# Patient Record
Sex: Female | Born: 1955 | Race: White | Hispanic: No
Health system: Western US, Academic
[De-identification: ages and names within clinical notes are randomized; demographics above are authoritative.]

## PROBLEM LIST (undated history)

## (undated) DIAGNOSIS — E785 Hyperlipidemia, unspecified: Secondary | ICD-10-CM

## (undated) DIAGNOSIS — M159 Polyosteoarthritis, unspecified: Secondary | ICD-10-CM

## (undated) HISTORY — DX: Polyosteoarthritis, unspecified: M15.9

## (undated) HISTORY — DX: Hyperlipidemia, unspecified: E78.5

## (undated) HISTORY — PX: SURGICAL HX OTHER: 99

## (undated) HISTORY — PX: PR TOTAL ABDOMINAL HYSTERECT W/WO RMVL TUBE OVARY: 58150

---

## 2013-01-29 ENCOUNTER — Other Ambulatory Visit: Payer: Self-pay

## 2013-02-05 ENCOUNTER — Encounter (HOSPITAL_BASED_OUTPATIENT_CLINIC_OR_DEPARTMENT_OTHER): Payer: BLUE CROSS/BLUE SHIELD | Admitting: Family Medicine

## 2013-02-12 ENCOUNTER — Encounter (HOSPITAL_BASED_OUTPATIENT_CLINIC_OR_DEPARTMENT_OTHER): Payer: Self-pay | Admitting: Family Medicine

## 2013-02-12 ENCOUNTER — Ambulatory Visit: Payer: BLUE CROSS/BLUE SHIELD | Attending: Family Medicine | Admitting: Family Medicine

## 2013-02-12 VITALS — BP 119/81 | HR 88 | Temp 97.8°F | Ht 62.0 in | Wt 128.0 lb

## 2013-02-12 DIAGNOSIS — M7500 Adhesive capsulitis of unspecified shoulder: Secondary | ICD-10-CM | POA: Insufficient documentation

## 2013-02-12 NOTE — Progress Notes (Signed)
Cc: R shoulder pain    HPI:  Yolanda Wright is a 58 year old female who presents for evaluation of R shoulder pain.  Pain started in January during traveling.  No new activity or trauma.  But did have big luggage.    Saw PCP and was offered a cortisone injection.  Tried ibuprofen.  Has not been in PT.  Has lost ROM.  Feels stiff.    Has some neck pain.    Has carpal tunnel B/L so some numbness and tingling.      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    Exam:  Blood pressure 119/81, pulse 88, temperature 97.8 F (36.6 C), temperature source Oral, height 5\' 2"  (1.575 m), weight 128 lb (58.06 kg), SpO2 98.00%.  Gen- NAD  Neck- FROM in flexion, extension, side flexion and rotation without exacerbation of shoulder pain  R shoulder- no muscle atrophy, limited active ROM in flexion 130, abduction 140, external rotation 20, and internal rotation sacrum, passive ROM flexion 130, ext rot 25 (vs left 35), normal deltoid and RTC strength, 5/5 abduction, 5/5 external rotation with elbow at side, 5/5 Jobe's position without pain, non-tender over AC jt, anterior subacromial region, and biceps tendon      A/P:  (726.0) Frozen shoulder, right  (primary encounter diagnosis)  Plan: REFERRAL TO PHYSICAL THERAPY  - discussed pathophysiology and expected disease course  - believe she is just starting stiffness phase  - referred to PT  - f/u 2 months      Caren Griffins, MD

## 2013-04-24 ENCOUNTER — Encounter (HOSPITAL_BASED_OUTPATIENT_CLINIC_OR_DEPARTMENT_OTHER): Payer: BLUE CROSS/BLUE SHIELD | Admitting: Family Medicine

## 2013-07-22 ENCOUNTER — Ambulatory Visit (INDEPENDENT_AMBULATORY_CARE_PROVIDER_SITE_OTHER): Payer: BLUE CROSS/BLUE SHIELD

## 2013-07-22 ENCOUNTER — Other Ambulatory Visit (INDEPENDENT_AMBULATORY_CARE_PROVIDER_SITE_OTHER): Payer: Self-pay

## 2013-07-22 VITALS — BP 114/70 | HR 79 | Ht 63.0 in | Wt 131.0 lb

## 2013-07-22 LAB — COMPREHENSIVE METABOLIC PANEL
ALT (GPT): 55 U/L — ABNORMAL HIGH (ref 6–40)
AST (GOT): 52 U/L — ABNORMAL HIGH (ref 15–40)
Albumin: 4 g/dL (ref 3.5–5.2)
Alkaline Phosphatase (Total): 50 U/L (ref 31–132)
Anion Gap: 6 (ref 3–11)
Bilirubin (Total): 0.9 mg/dL (ref 0.2–1.3)
Calcium: 9.4 mg/dL (ref 8.9–10.2)
Carbon Dioxide, Total: 27 mEq/L (ref 22–32)
Chloride: 102 mEq/L (ref 98–108)
Creatinine: 0.66 mg/dL (ref 0.38–1.02)
GFR, Calc, African American: >60 mL/min (ref >59)
GFR, Calc, European American: >60 mL/min (ref >59)
Glucose: 96 mg/dL (ref 62–125)
Potassium: 4.1 mEq/L (ref 3.7–5.2)
Protein (Total): 6.7 g/dL (ref 6.0–8.2)
Sodium: 135 mEq/L — ABNORMAL LOW (ref 136–145)
Urea Nitrogen: 9 mg/dL (ref 8–21)

## 2013-07-22 LAB — CBC, DIFF
% Basophils: 1 %
% Eosinophils: 3 %
% Immature Granulocytes: 0 %
% Lymphocytes: 37 %
% Monocytes: 6 %
% Neutrophils: 53 %
Absolute Eosinophil Count: 0.11 10*3/uL (ref 0.00–0.50)
Absolute Lymphocyte Count: 1.41 10*3/uL (ref 1.00–4.80)
Basophils: 0.04 10*3/uL (ref 0.00–0.20)
Hematocrit: 38 % (ref 36–45)
Hemoglobin: 12.9 g/dL (ref 11.5–15.5)
Immature Granulocytes: 0 10*3/uL (ref 0.00–0.05)
MCH: 31 pg (ref 27.3–33.6)
MCHC: 34.4 g/dL (ref 32.2–36.5)
MCV: 90 fL (ref 81–98)
Monocytes: 0.23 10*3/uL (ref 0.00–0.80)
Neutrophils: 2.02 10*3/uL (ref 1.80–7.00)
Platelet Count: 221 10*3/uL (ref 150–400)
RBC: 4.16 mil/uL (ref 3.80–5.00)
RDW-CV: 13.2 % (ref 11.6–14.4)
WBC: 3.81 10*3/uL — ABNORMAL LOW (ref 4.3–10.0)

## 2013-07-22 LAB — CK, CREATINE KINASE, TOTAL ACTIVITY: Creatine Kinase Total Activity: 467 U/L — ABNORMAL HIGH (ref 30–231)

## 2013-07-22 LAB — THYROID STIMULATING HORMONE: Thyroid Stimulating Hormone: 0.779 u[IU]/mL (ref 0.400–5.000)

## 2013-07-22 LAB — SED RATE: Erythrocyte Sedimentation Rate: 18 mm/hr (ref 0–20)

## 2013-07-22 LAB — C_REACTIVE PROTEIN: C_Reactive Protein: 1.6 mg/L (ref 0.0–10.0)

## 2013-07-22 MED ORDER — PREDNISONE 5 MG OR TABS
5.0000 mg | ORAL_TABLET | Freq: Every day | ORAL | Status: AC
Start: 2013-07-22 — End: ?

## 2013-07-22 MED ORDER — GABAPENTIN 100 MG OR CAPS
ORAL_CAPSULE | ORAL | Status: AC
Start: 2013-07-22 — End: ?

## 2013-07-22 NOTE — Progress Notes (Signed)
ENCOUNTER DATE: 07/22/2013    PATIENT: Yolanda Wright  DATE OF BIRTH: 19-Feb-1956  [id number]    RHEUMATOLOGY: Yolanda Spatz, MD  PCP: Yolanda Wright          Dear Dr. Leonides Wright,    It is a pleasure meeting Yolanda Wright today.    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    Yolanda Wright comes in today with her husband, Yolanda Wright.  She complains of pain and shows this and the pain diagram to include her neck, low back, left thigh, hands and wrists.  Not shown its her right shoulder were she's had a frozen shoulder as well.    After her twins were born 36 years ago, she developed neck and back pain which is been chronic.  5 years ago, during a trip to [place], her home country, she developed sudden onset of swelling in her joints.  She's had swelling and pain since.  On prednisone.  She had about 50% improvement.  Labs showed elevated CPKs and she was diagnosed with a myositis.  She apparently had nerve studies, which showed carpal tunnel syndrome.  She's not had surgery.  She's had other studies which are not available to me.    She's been on varying doses of prednisone and azathioprine over the last 4 years, but without complete remission.  Her CPK values have ranged between 300 and 600.     Yolanda Wright is also noted some food sensitivities.  She's been off gluten for the past year.       Pain in the past week is 6 on a scale of 10, where 10 is "PAIN AS BAD as it could be".   "How are you doing?" is 6 on a scale of 10, where 10 is "VERY POORLY".   Aggravators: activities   Alleviators:  Prednisone and anti-inflammatories     Functional limitations noted on the standardized MDHAQ functional survey are:  Limitations and 8 out of 10 activities of daily living.  Unable to get a good night sleep, much difficulty dealing with anxiety and some difficulties dealing with  depression.Marland Kitchen       PROBLEM LIST:  Patient Active Problem List   Diagnosis   . Arthritis       MEDICATIONS:  Current Outpatient Prescriptions   Medication Sig   . Estradiol (VIVELLE-DOT) 0.075 MG/24HR Transdermal PATCH BIWEEKLY Place 1 patch on the skin 2 times a week.     No current facility-administered medications for this visit.        Comprehensive new patient questionnaire was reviewed in detail and scanned into the chart.  Pertinent positives and negatives will be noted.   PAST HISTORY: skin cancers  PAST SURGERIES: none  FAMILY HX:  Mother died of Alzheimer's and 61.  Father had emphysema and died at age 54.  4 children in good health, and her 30s.   SOCIAL HX: See history in EPIC.  HABITS: past smoking 20 years ago, rare alcohol.  Regular exercise with yoga, walking and stair master  REVIEW OFSYSTEMS:  13 system review (constitutional, musculoskeletal, ENT, GU, psych, GI, neuro, skin, heme, endo, eyes, cardiovas, respiratory) is obtained with previsit questionnaire that the patient filled out during today's visit.  This was reviewed in its entirety and is scanned into the record.  All are negative except these  positives: fatigue, weakness, ringing in ears, difficulty swallowing, swollen legs or feet, nocturia, vaginal dryness, night sweats, headaches and dizziness, easy bruising, anxiety, depression and poor sleep as well as pain in her hands, wrists, ankles and knees.    EXAM:  BP 114/70  Pulse 79  Ht 5\' 3"  (1.6 m)  Wt 131 lb (59.421 kg)  BMI 23.21 kg/m2  General: Pleasant and attentive. Good historian  Skin: Without signs of malar rash, nail bed pitting, vasculitic skin lesions or periungual erythema. No heliotrope.  No Gottron's patches  HEENT: Without ear swelling, very orbital edema, parotid enlargement, saddle nose, or mouth sores; eyes: without icterus, scleritis or conjunctivitis.    Neck: No thyroid enlargement, or adenopathy.    Lungs: Without audible rales or respiratory distress.    Heart:  Regular rhythm without murmurs or rubs.    Abd: Without organomegaly or general tenderness.    Neurologic: Symmetric movement of all extremities, normal light touch sensation, normal gait, normal facial movement.    MSK: mild swelling and tenderness of her wrists.  Normal neck range of motion.  No focal SI tenderness    LABS:   No results found for any previous visit.  Outside labs show CPKs were the last year and a half from 300-600.    XRAY/DIAGNOSTICS:    X RAY REPORT      EXAM: pelvis, hand, thoracic spine x-rays  DATE:  07/22/2013  INDICATIONS: evaluate for possible spondylo-arthritis  FINDINGS:  Pelvis x-ray: Hips are normal.  SI joints are normal.  There spurs lateral to the SI joint and a tendon insertion bilaterally.  Thoracic spine x-ray: Lateral spine shows degenerative spurs at several levels.  Once per extends outward remote from the corner of the vertebral body suggesting an inflammatory enthesis type spur.  Hand x-rays: No signs of condor calcic stenosis.  No carpal, MCP, PIP or DIP joint erosions or narrowing.   IMPRESSION:  1.  Nonspecific spurring lateral to the SI joints, bilateral  2.  Degenerative disc disease, thoracic spine  3.  Possible thoracic spine early inflammatory spur      ASSESSMENT:   1. 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 Polyarthritis.   Hands and wrists  4.  Food sensitivities   gluten  5.  Sleep disorder   Pain  6.  Osteopenia   DEXA, 07/22/13, T-scores: Spine = -0.9; femoral neck = -1.2   Hot flashes  7.  History of carpal tunnel syndrome    DISCUSSION & COMPLEXITY OF DECISION MAKING:  [name]'s syndrome is complex.  Does not fit into the typical myositis pattern.  She has back pain and polyarthritis, suggesting a spondyloarthropathy.  I have had patients with the spondylo-arthritis syndromes who've had mild CPK elevations.    PLAN: we discussed the following plan:  1.  NEED ALL MRI, NERVE, BASELINE BLOOD TESTS       2.  GABAPENTIN  -   100 MG BEFORE BED;  INCREASE UNTIL +  OR  -.   USUALLY 2 TO 8 PILLS PER NIGHT    3.  DEXA    4.  CXR    5.  LATER TRIAL OF PROZAC/FLUXOTIENE    6. LABS    7. PREDNISONE - 5 MG PER DAY      Thank you for allowing me to assist in Cathi's consultative care.  This visit was 60 minutes with the majority counseling..    Sincerely,  Joselyn Glassman Elwyn Reach, MD MPH    CC:  Yolanda Wright [phone]   Dr. Loreta Ave, homeopath   Kyla Balzarine

## 2013-07-22 NOTE — Patient Instructions (Addendum)
1.  NEED ALL MRI, NERVE, BASELINE BLOOD TESTS       2.  GABAPENTIN  -  100 MG BEFORE BED;  INCREASE UNTIL +  OR  -.   USUALLY 2 TO 8 PILLS PER NIGHT    3.  DEXA    4.  CXR    5.  LATER TRIAL OF PROZAC/FLUXOTIENE    6. LABS    7. PREDNISONE - 5 MG PER DAY        Care Coordination & Working Together   My Fax: [phone]   [facility] Office phone: [phone]    1.  Please join the eCare part of EPIC so we can be in email communication. Until that time and for backup, my personal (not encrypted) email is [email or web address]. Email considerations:   We really can't provide new problem assessment or new care via email. EMAIL is for you to give me feedback a simple questions.   For most problems, please start with your Primary Care Physician. Every [facility] patient must have a PCP.   I will notify you if lab abnormalities are important.  We will discuss ALL LABS at your next visit.    2. For each visit, please bring ONE PAGE SUMMARY FOR ME TO SEE AT THE BEGINNING OF EACH VISIT, in addition to our function and symptom questionnaire that we have you fill out. Please include:   Names and fax numbers of everyone on your  CARE TEAM   ALL OF YOUR MEDICATIONS:  Name, times per day, milligram/pill, and the reason you are taking it.   WHAT MEDS NEED REFILLED?   YOUR SELF-CARE PROGRAM:  What are you doing and what are your goals?   NEW SYMPTOMS, OLD SYMPTOM progress - list each with a few words of what helps or aggravates each.   QUESTIONS: What do you want me to address at the visit?   RECOMMENDATIONS: What do you think should be done?    3.  FOR THE FRONT DESK AND CLAUDIA, you will need to understand your insurance plan, formulary options for medications, authorizations required for referrals, etc.      4. CONSIDER creating a MEDICAL NOTEBOOK that you take to each doctor's appointment.  It might include:  a. Your visit preparation sheet for each visit.  b. The visit summary I give you or you get from others.  c. Copies of  tests and reports from all of your providers.   d. Information you read and find helpful about your illness and therapies.   e. Your summary health summary with dates:    All diagnoses and surgeries   Consultants seen and for what problem   Therapies tried and benefits and side effects   Vaccinations   Family history   Work and hobbies    5.  If you want COPIES of labs or my notes sent to other physicians or providers, make sure to bring their names and their fax numbers.    6. If you have an URGENT problem, there is someone on call for [facility] every day.  Please have other physicians send [place] notes and diagnostic studies.         Dr. Kary Kos Big 8 areas for Self-care.    These are 8 areas that I encourage you to consider for self-care with the purpose of stabilizing your immune and nervous system:    1.  Eliminate toxins: Toxins include alcohol, smoking, marijuana unless otherwise discussed.  I also include here deep toxic  relationships with individuals or with events of the past. If these are not let go, it is hard for the body to heal and move head.    2.  Sleep: Restorative sleep is essential for immune system and metabolic balance.  Not falling asleep, early awakening, or sleep apnea can all contribute.  A sleep consultation and evaluation may be necessary.  Stopping all computer and phone activities more than hour before bed is a good start.  Early in the evening, consider writing down issues that you may want to pursue tomorrow so you can free your mind for sleep.  Try to sleep in a cool bed and room.    3.  Stress and distress: I use these 2 words because one has to do with using medications and the other with how you think.  "Letting go" is an important moment by moment awareness for diminishing the impacts of stress. Meditation training is a good start.  Ask me about "[facility]".  However, just as you can run your car low in gas or oil, you also can deplete your body of important stabilizing chemicals  such as serotonin or dopamine.  I frequently discuss diagnostic trials of these appropriate "replacement medications".  Every person who comes into [facility] deserves an opportunity to meet with a chronic illness and disability counselor such as Larina Earthly MA [facility], [facility] chronic illness counselor.     4.  Diet and nutrition: Nutrition has been proven to be an important issue in recent years for inflammation problems.  Fish oil is anti-inflammatory at 3000 mg of EPA plus DHA.  Some people are sensitive to gluten, meat products, nightshade's, or milk products.  Seeing Leim Fabry, MS, RD, CD is an excellent start to evaluating and learning about these issues.  She is the only [place] trained registered dietitian who works with a group of rheumatologists.    5. Exercise: For patients with arthritis and pain, this can sound like a bad word.  I encourage you to start on a daily basis trying movement activities that don't hurt. Ask me about "bicycle meditation", pool therapy, yoga, Feldenkrais or Tai Chi.    6. Social:  Some people with illness isolate themselves and lose ability or interest in activities.  This is the part of life that needs to be reconsidered.  Some push themselves hard and try not to think about their problems and do not give their body a chance to recover.  This often creates a "mind-body dissociation", leading to difficulties preventing reinjury, monitoring fatigue, and understanding what their illness requires of them to do differently.  Is this you, or are you too afraid to do anything that cause pain or increases your risk for a flare?  If this sounds like you, talk to me about balance & pacing, listening to your body, flare prevention and management, and fear.    7. Creative force:  What brings meaning in life for you?  Do you have an inner energy or creative force? Where does your creative force come from? How do you direct it or how does it direct you?  How do you renew it or does it get renewed?  This may be the glue to tie all the others together.     8.  Laugh more!!

## 2013-07-23 LAB — ANA REFLEX COMPREHENSIVE PANEL
ANA: NEGATIVE
Ana Interpretation Comment 1: BORDERLINE

## 2013-07-23 LAB — HEPATITIS C AB WITH REFLEX PCR: Hepatitis C Antibody w/Rflx PCR: NONREACTIVE

## 2013-07-23 LAB — LAB UNDEFINED ORCA/EPIC ORDER

## 2013-07-23 LAB — RHEUMATOID FACTOR: Rheumatoid Factor: <13 IU/mL (ref <13)

## 2013-07-23 LAB — IMMUNOGLOBULINS A,G,M,E
Immunoglobulin A: 220 mg/dL (ref 65–420)
Immunoglobulin E: <20 U/mL (ref 0–300)
Immunoglobulin G: 1340 mg/dL (ref 620–1490)
Immunoglobulin M: 127 mg/dL (ref 40–350)

## 2013-07-23 LAB — ANTI NEUTROPHIL CYTOPLASM PNL
Anti MPO Comment: NEGATIVE
Anti MPO Level: <0.2 AI (ref 0.0–0.7)
Anti PR3 Comment: NEGATIVE
Anti PR3 Level: <0.2 AI (ref 0.0–0.7)
Antineutrophil Cytoplasmic Antibody by IF: NEGATIVE

## 2013-07-23 LAB — ANTI C. CITRULLINATED PEPTIDE2
Anti CCP2 Comment: NEGATIVE
Anti CCP2 Level: <0.5 U/mL (ref 0.0–1.5)

## 2013-07-23 LAB — CELIAC SEROLOGY REFLEX PANEL
Anti Deaminated Gliadin, IgG: 1 U (ref 0–13)
Anti tTransglutaminase, IgA: <1 U (ref 0–13)
Celiac Panel Comment 1: NEGATIVE

## 2013-07-24 LAB — ANGIOTENSIN CONVERTING ENZYME: Angiotensin Converting Enzyme (U/L) (Sendout): 51 U/L (ref 8–53)

## 2013-07-24 LAB — LYME DISEASE ANTIBODY: Lyme Disease Serology: NEGATIVE

## 2013-07-24 LAB — VITAMIN D (25 HYDROXY)
Vit D (25_Hydroxy) Total: 22.6 ng/mL (ref 20.1–50.0)
Vitamin D2 (25_Hydroxy): <1 ng/mL
Vitamin D3 (25_Hydroxy): 22.6 ng/mL

## 2013-07-27 LAB — ZINC: Zinc: 68 ug/dL (ref 60–120)

## 2013-07-27 LAB — REFERENCE LABORATORY TEST 1

## 2013-07-29 ENCOUNTER — Other Ambulatory Visit (INDEPENDENT_AMBULATORY_CARE_PROVIDER_SITE_OTHER): Payer: BLUE CROSS/BLUE SHIELD

## 2013-08-03 ENCOUNTER — Ambulatory Visit (INDEPENDENT_AMBULATORY_CARE_PROVIDER_SITE_OTHER): Payer: BLUE CROSS/BLUE SHIELD

## 2013-08-03 ENCOUNTER — Other Ambulatory Visit (INDEPENDENT_AMBULATORY_CARE_PROVIDER_SITE_OTHER): Payer: Self-pay

## 2013-08-03 VITALS — BP 104/76 | HR 102 | Temp 100.0°F | Ht 63.0 in | Wt 131.0 lb

## 2013-08-03 LAB — COMPREHENSIVE METABOLIC PANEL
ALT (GPT): 44 U/L — ABNORMAL HIGH (ref 6–40)
AST (GOT): 47 U/L — ABNORMAL HIGH (ref 15–40)
Albumin: 3.9 g/dL (ref 3.5–5.2)
Alkaline Phosphatase (Total): 48 U/L (ref 31–132)
Anion Gap: 6 (ref 3–11)
Bilirubin (Total): 0.5 mg/dL (ref 0.2–1.3)
Calcium: 8.9 mg/dL (ref 8.9–10.2)
Carbon Dioxide, Total: 26 mEq/L (ref 22–32)
Chloride: 103 mEq/L (ref 98–108)
Creatinine: 0.67 mg/dL (ref 0.38–1.02)
GFR, Calc, African American: >60 mL/min (ref >59)
GFR, Calc, European American: >60 mL/min (ref >59)
Glucose: 85 mg/dL (ref 62–125)
Potassium: 4.3 mEq/L (ref 3.7–5.2)
Protein (Total): 6.6 g/dL (ref 6.0–8.2)
Sodium: 135 mEq/L — ABNORMAL LOW (ref 136–145)
Urea Nitrogen: 6 mg/dL — ABNORMAL LOW (ref 8–21)

## 2013-08-03 LAB — CBC, DIFF
% Basophils: 2 %
% Eosinophils: 3 %
% Immature Granulocytes: 0 %
% Lymphocytes: 25 %
% Monocytes: 12 %
% Neutrophils: 58 %
Absolute Eosinophil Count: 0.11 10*3/uL (ref 0.00–0.50)
Absolute Lymphocyte Count: 0.88 10*3/uL — ABNORMAL LOW (ref 1.00–4.80)
Basophils: 0.07 10*3/uL (ref 0.00–0.20)
Hematocrit: 36 % (ref 36–45)
Hemoglobin: 12.3 g/dL (ref 11.5–15.5)
Immature Granulocytes: 0 10*3/uL (ref 0.00–0.05)
MCH: 31.2 pg (ref 27.3–33.6)
MCHC: 34.4 g/dL (ref 32.2–36.5)
MCV: 91 fL (ref 81–98)
Monocytes: 0.42 10*3/uL (ref 0.00–0.80)
Neutrophils: 2.04 10*3/uL (ref 1.80–7.00)
Platelet Count: 192 10*3/uL (ref 150–400)
RBC: 3.94 mil/uL (ref 3.80–5.00)
RDW-CV: 13.1 % (ref 11.6–14.4)
WBC: 3.51 10*3/uL — ABNORMAL LOW (ref 4.3–10.0)

## 2013-08-03 LAB — CK, CREATINE KINASE, TOTAL ACTIVITY: Creatine Kinase Total Activity: 454 U/L — ABNORMAL HIGH (ref 30–231)

## 2013-08-03 MED ORDER — FLUOXETINE HCL 10 MG OR CAPS
10.0000 mg | ORAL_CAPSULE | Freq: Every day | ORAL | Status: AC
Start: 2013-08-03 — End: ?

## 2013-08-03 MED ORDER — GABAPENTIN 300 MG OR CAPS
ORAL_CAPSULE | ORAL | Status: AC
Start: 2013-08-03 — End: ?

## 2013-08-03 NOTE — Progress Notes (Signed)
I        ENCOUNTER DATE:  08/03/2013   PATIENT: Yolanda Wright  1956-04-09   [id number]    RHEUMATOLOGY: Magdalen Spatz, MD  PCP: Donata Duff      HISTORY:  The patient's issues for today's visit are: first follow-up visit    Overall, doing much better.  Sleeping better. Prednisone 1 a day is helpful in the mornings.     Pain in the past week is 3 on a scale of 10, where 10 is "PAIN AS BAD as it could be".   "How are you doing?" is 2 on a scale of 10, where 10 is "VERY POORLY".   Aggravators: activities   Alleviators:  prednisone     Functional limitations noted on the standardized MDHAQ functional survey are:  Limitations and 7 out of 10 activities of daily living.  Having some difficulty still getting good night sleep, dealing with anxiety as well as depression.       EPIC PROBLEM LIST:   Patient Active Problem List   Diagnosis   . Arthritis     MEDICATIONS:   Current Outpatient Prescriptions   Medication Sig   . Estradiol (VIVELLE-DOT) 0.075 MG/24HR Transdermal PATCH BIWEEKLY Place 1 patch on the skin 2 times a week.   . Gabapentin 100 MG Oral Cap 1-3 qhs   . PredniSONE 5 MG Oral Tab Take 1 tablet (5 mg) by mouth daily.     No current facility-administered medications for this visit.     PAST HISTORY: reviewed and updated EPIC history, social history, family history sections. The comprehensive problem list below was updated.  See problem list below  ROS:  13 system review (constitutional, musculoskeletal, ENT, GU, psych, GI, neuro, skin, heme, endo, eyes, cardiovas, respiratory) is obtained with previsit questionnaire that the patient filled out during today's visit.  This was reviewed in its entirety and is scanned into the record.  All are negative except these positives: 30 minutes.  Morning stiffness, swelling, muscle weakness, night sweats, dry mouth, ringing in her ears, balance problems, numbness or legs, pain in the chest, heartburn, constipation    EXAM:  BP 104/76  Pulse 102  Temp(Src) 100 F  (37.8 C) (Oral)  Ht 5\' 3"  (1.6 m)  Wt 131 lb (59.421 kg)  BMI 23.21 kg/m2  General: Pleasant and attentive. Good communicator  Skin: Without signs of malar rash, nail bed pitting, vasculitic skin lesions or periungual erythema.    HEENT: Without ear swelling, very orbital edema, parotid enlargement, saddle nose, or mouth sores; eyes: without icterus, scleritis or conjunctivitis.    Neck: No thyroid enlargement, or adenopathy.    Lungs: Without audible rales or respiratory distress.    Heart: Regular rhythm without murmurs or rubs.    Abd: Without organomegaly or general tenderness.    Neurologic: Symmetric movement of all extremities, normal light touch sensation, normal gait, normal facial movement.    MSK: no synovitis of her hands.  Large joints move normally.    LABS:   Patient Active Problem List   Component Date Value Range Status   . Reference Lab: Spec. Type 1 (Sendo* 07/22/2013 Serum   Final   . Reference Lab: Test Reqstd 1 (Send* 07/22/2013 Saccharomyces Antibodies, IgA & IgG   Corrected    CORRECTED ON 12/04 AT 5409: PREVIOUSLY REPORTED AS SACCHAROMYCES ANTIBODY   . Reference Lab: Result 1 (Sendout) 07/22/2013 (NOTE)   Final    Comment: RESULTS:                            [  Saccharomyces cerevisiae Ab, IgA:  19.2 U].                            [ Saccharomyces cerevisiae Ab, IgG: <10.0 U].    . Reference Lab: Ref. Range 1 (Sendo* 07/22/2013 (NOTE)   Final    Comment: REFERENCE RANGES:                            Saccharomyces cerevisiae Ab, IgA and IgG:                            [ Negative:      < or = 20.0 U].                            [ Equivocal:     20.1 - 24.9 U].                            [ Weak Positive: 25.0 - 34.9 U].                            [ Positive:      > or = 35.0 U].    . Reference Lab: Name 1 (Sendout) 07/22/2013    Final                    Value:Test Performed By: [facility] Dept of Lab Med and                           Pathology   Patient Active Problem List   Component Date  Value Range Status   . WBC 07/22/2013 3.81* 4.3 - 10.0 THOU/uL Final   . RBC 07/22/2013 4.16  3.80 - 5.00 mil/uL Final   . Hemoglobin 07/22/2013 12.9  11.5 - 15.5 g/dL Final   . Hematocrit 16/05/9603 38  36 - 45 % Final   . MCV 07/22/2013 90  81 - 98 fL Final   . MCH 07/22/2013 31.0  27.3 - 33.6 pg Final   . MCHC 07/22/2013 34.4  32.2 - 36.5 g/dL Final   . Platelet Count 07/22/2013 221  150 - 400 THOU/uL Final   . RDW-CV 07/22/2013 13.2  11.6 - 14.4 % Final   . % Total Neut. 07/22/2013 53   Final   . % Lymphocytes 07/22/2013 37   Final   . % Monocytes 07/22/2013 6   Final   . % Eosinophils 07/22/2013 3   Final   . % Basophils 07/22/2013 1   Final   . % Immature Granulocytes 07/22/2013 0   Final   . Neutrophils 07/22/2013 2.02  1.80 - 7.00 THOU/uL Final   . Lymphocytes 07/22/2013 1.41  1.00 - 4.80 THOU/uL Final   . Monocytes 07/22/2013 0.23  0.00 - 0.80 THOU/uL Final   . Eosinophils 07/22/2013 0.11  0.00 - 0.50 THOU/uL Final   . Basophils 07/22/2013 0.04  0.00 - 0.20 THOU/uL Final   . Immature Granulocytes 07/22/2013 0.00  0.00 - 0.05 THOU/uL Final   . RBC Morphology 07/22/2013 See CBC - no additional findings   Final   . Platelet Morphology 07/22/2013 See CBC - no additional findings  Final   . WBC Morphology 07/22/2013 See CBC - no additional findings   Final   . Sodium 07/22/2013 135* 136 - 145 mEq/L Final   . Potassium 07/22/2013 4.1  3.7 - 5.2 mEq/L Final   . Chloride 07/22/2013 102  98 - 108 mEq/L Final   . Carbon Dioxide, Total 07/22/2013 27  22 - 32 mEq/L Final   . Anion Gap 07/22/2013 6  3 - 11 Final   . Glucose 07/22/2013 96  62 - 125 mg/dL Final   . Urea Nitrogen 07/22/2013 9  8 - 21 mg/dL Final   . Creatinine 16/05/9603 0.66  0.38 - 1.02 mg/dL Final   . Protein (Total) 07/22/2013 6.7  6.0 - 8.2 g/dL Final   . Albumin 54/04/8118 4.0  3.5 - 5.2 g/dL Final   . Bilirubin (Total) 07/22/2013 0.9  0.2 - 1.3 mg/dL Final   . Calcium 14/78/2956 9.4  8.9 - 10.2 mg/dL Final   . AST (GOT) 21/30/8657 52* 15 - 40 U/L  Final   . Alkaline Phosphatase (Total) 07/22/2013 50  31 - 132 U/L Final   . ALT (GPT) 07/22/2013 55* 6 - 40 U/L Final   . Gfr, Calc, European American 07/22/2013 >60  >59 mL/min Final   . GFR, Calc, African American 07/22/2013 >60  >59 mL/min Final   . GFR, Information 07/22/2013    Final                    Value:Calculated GFR in mL/min/1.73 m2 by MDRD equation.                            Inaccurate with changing renal function.  See                           [email or web address]                             . CRP, High Sensitivity 07/22/2013 1.6  0.0 - 10.0 mg/L Final   . Anti CCP2 Level 07/22/2013 <0.5  0.0 - 1.5 U/mL Final   . Anti CCP2 Comment 07/22/2013 Negative   Final   . Rheumatoid Factor 07/22/2013 <13  <13 IU/mL Final   . Erythrocyte Sedimentation Rate 07/22/2013 18  0 - 20 mm/hr Final   . ANA 07/22/2013 Negative  NRN Final   . ANA Pattern by IF 07/22/2013 None   Final   . Ana Screen By Multiplex 07/22/2013 See ANA Interpretation Comment* NRN Final   . Ana Interpretation Comment 1 07/22/2013    Final                    Value:Screen negative by IFA and indeterminate/borderline                           result for one or more of the following antibodies:                           chromatin, ribosomal P, Sm, RNP, SSA/Ro, SSB/La,                           centromere, Scl70 and Jo1.    Comment: With negative IFA results the indeterminate multiplex result is  likely to be                            clinically insignificant.  To order the complete reflex panel contact the lab                            at [phone].   . Angiotensin Converting Enzyme (U/L* 07/22/2013 51  8 - 53 U/L Final    Comment: (NOTE)                           Test Performed by:                           [facility]                           [address] [place], [place], [place] [address]                           Laboratory Director: Lyda Jester A. Tonny Bollman, M.D.   . Lab Test  Requested 07/22/2013 Saccharomyces Cerevisiae Antibodies (Code: 332)   Final   . Specimen Type/Description 07/22/2013 Blood   Final   . Additional Test Information 07/22/2013 N/A   Final   . Test Request Status 07/22/2013 Order Processed   Final   . Anti tTransglutaminase, IgA 07/22/2013 <1  0 - 13 U Final    Methodology and reference range changed on 06/22/13.   Marland Kitchen Anti Deaminated Gliadin, IgG 07/22/2013 1  0 - 13 U Final    Methodology and reference range changed on 06/22/13.   . Celiac Panel Comment 1 07/22/2013   CELNEG Final                    Value:Negative celiac disease serology panel. These results                           indicate that active celiac disease is highly unlikely                           at the time of the antibody testing.   . Vitamin D2 (25_Hydroxy) 07/22/2013 <1.0   Final   . Vitamin D3 (25_Hydroxy) 07/22/2013 22.6   Final   . Vit D (25_Hydroxy) Total 07/22/2013 22.6  20.1 - 50.0 ng/mL Final   . Vit D (25_Hydroxy) Interp 07/22/2013 Normal:            20.1-50.0 ng/mL   Final    Comment: For more information see                            [email or web address].   Marland Kitchen Zinc 07/22/2013 68  60 - 120 mcg/dL Final   . Lyme Disease Serology (Sendout) 07/22/2013 Negative  NRN Final    Comment: (NOTE)                           Serologic response to B. burgdorferi infection is not  detected, but cannot rule out early infection during                           which low or undetectable antibody levels to                           B. burgdorferi may be present. If clinically indicated,                           a new serum specimen should be submitted in 7-14 days.                           Test Performed by:                           [facility]                           [address] [place], [place], [place] [address]                           Laboratory Director: Lyda Jester A. Tonny Bollman, M.D.   . CREATINE KINASE TOTAL  ACTIVITY 07/22/2013 467* 30 - 231 U/L Final   . Hepatitis C Ab w/Rflx PCR 07/22/2013 Nonreactive  NREAC Final   . Hepatitis C Ab Interpretation 07/22/2013    Final                    Value:No evidence of antibody to hepatitis C virus (HCV).                            Anti-HCV may develop slowly (6 weeks - 6 months) over                           the course of hepatitis C virus infection.  Rarely it                           may be absent in cases of chronic infection.  Anti-HCV                           may also disappear after recovery from acute,                           self-limited disease.   . Thyroid Stimulating Hormone 07/22/2013 0.779  0.400 - 5.000 uIU/mL Final   . Immunoglobulin A 07/22/2013 220  65 - 420 mg/dL Final   . Immunoglobulin G 07/22/2013 1340  620 - 1490 mg/dL Final   . Immunoglobulin M 07/22/2013 127  40 - 350 mg/dL Final   . Immunoglobulin E 07/22/2013 <20  0 - 300 U/mL Final   . ANCA, by IF 07/22/2013 Negative  NRN Final   . Anti PR3 Level 07/22/2013 <0.2  0.0 - 0.7 AI Final   . Anti PR3 Comment 07/22/2013 Negative  NRN Final   . Anti MPO Level 07/22/2013 <0.2  0.0 - 0.7 AI Final   . Anti MPO Comment 07/22/2013 Negative  NRN Final          XRAY/DIAGNOSTICS: see list below    UPDATED PROBLEM LIST:  1. 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 Polyarthritis.   Hands and wrists  4.  Food sensitivities   gluten  5.  Sleep disorder   Pain  6.  Osteopenia   DEXA, 07/22/13, T-scores: Spine = -0.9; femoral neck = -1.2   Hot flashes  7.  History of carpal tunnel syndrome  8. GERD   9. "Myositis", 2010   CPK = 467  10. 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  Depression, anxiety   Possible serotonin deficiency    ASSESSMENT:   1. 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  I reviewed such reports.  2. Mild anxiety and depression.  Discussed serotonin modulation of stress and immune system.    PLAN:  2.  Prolonged discussion of above issues, we agreed to the following plan:  1.  Prednisone - 5 mg in AM    2.  Check labs    3.  Vit D 2000 IU per day    4.  Bring reports    5. Gabapentin up to 900 mg per evening   300 mg & 100 mg pills    6.  Prozac 10 mg in AM    7.   See min 2 months    TIME COUNSELING:  The total time for this visit was over 40 minute visit, majority counseling.      CC:  Donata Duff    (fax: [phone])

## 2013-08-03 NOTE — Patient Instructions (Addendum)
1.  Prednisone - 5 mg in AM    2.  Check labs    3.  Vit D 2000 IU per day    4.  Bring reports    5. Gabapentin up to 900 mg per evening   300 mg & 100 mg pills    6.  Prozac 10 mg in AM    7 .  See min 2 months    Care Coordination & Working Together   My Fax: [phone]   [facility] Office phone: [phone]    1.  Please join the eCare part of EPIC so we can be in email communication. Until that time and for backup, my personal (not encrypted) email is [email or web address]. Email considerations:   We really can't provide new problem assessment or new care via email. EMAIL is for you to give me feedback a simple questions.   For most problems, please start with your Primary Care Physician. Every [facility] patient must have a PCP.   I will notify you if lab abnormalities are important.  We will discuss ALL LABS at your next visit.    2. For each visit, please bring ONE PAGE SUMMARY FOR ME TO SEE AT THE BEGINNING OF EACH VISIT, in addition to our function and symptom questionnaire that we have you fill out. Please include:   Names and fax numbers of everyone on your  CARE TEAM   ALL OF YOUR MEDICATIONS:  Name, times per day, milligram/pill, and the reason you are taking it.   WHAT MEDS NEED REFILLED?   YOUR SELF-CARE PROGRAM:  What are you doing and what are your goals?   NEW SYMPTOMS, OLD SYMPTOM progress - list each with a few words of what helps or aggravates each.   QUESTIONS: What do you want me to address at the visit?   RECOMMENDATIONS: What do you think should be done?    3.  FOR THE FRONT DESK AND CLAUDIA, you will need to understand your insurance plan, formulary options for medications, authorizations required for referrals, etc.      4. CONSIDER creating a MEDICAL NOTEBOOK that you take to each doctor's appointment.  It might include:  a. Your visit preparation sheet for each visit.  b. The visit summary I give you or you get from others.  c. Copies of tests and reports from all of your providers.      d. Information you read and find helpful about your illness and therapies.   e. Your summary health summary with dates:    All diagnoses and surgeries   Consultants seen and for what problem   Therapies tried and benefits and side effects   Vaccinations   Family history   Work and hobbies    5.  If you want COPIES of labs or my notes sent to other physicians or providers, make sure to bring their names and their fax numbers.    6. If you have an URGENT problem, there is someone on call for [facility] every day.  Please have other physicians send [place] notes and diagnostic studies.

## 2013-09-02 ENCOUNTER — Ambulatory Visit (INDEPENDENT_AMBULATORY_CARE_PROVIDER_SITE_OTHER): Payer: BLUE CROSS/BLUE SHIELD | Admitting: Registered"

## 2013-09-02 VITALS — Ht 63.0 in | Wt 130.0 lb

## 2013-09-02 DIAGNOSIS — R5383 Other fatigue: Secondary | ICD-10-CM | POA: Insufficient documentation

## 2013-09-02 DIAGNOSIS — IMO0001 Reserved for inherently not codable concepts without codable children: Secondary | ICD-10-CM | POA: Insufficient documentation

## 2013-09-02 DIAGNOSIS — H9319 Tinnitus, unspecified ear: Secondary | ICD-10-CM | POA: Insufficient documentation

## 2013-09-02 DIAGNOSIS — E781 Pure hyperglyceridemia: Secondary | ICD-10-CM

## 2013-09-02 DIAGNOSIS — R42 Dizziness and giddiness: Secondary | ICD-10-CM | POA: Insufficient documentation

## 2013-09-02 DIAGNOSIS — E7800 Pure hypercholesterolemia, unspecified: Secondary | ICD-10-CM

## 2013-09-02 DIAGNOSIS — F419 Anxiety disorder, unspecified: Secondary | ICD-10-CM | POA: Insufficient documentation

## 2013-09-02 NOTE — Progress Notes (Signed)
History    Yolanda Wright is here primarily to determine if there is anything she can do with her diet to reduce her cholesterol (408) and triglycerides (>600 ppr).  She has failed medications to reduce and has seen only moderate benefits from niacin and garlic supplementation.  She is doing well with systemic inflammation/swelling, though still experiences and energy crash by the afternoon, hot flashes, poor sleep, anxiety, sinus pain/pressure, tinnitus, gas and persistent belching.  Her joint swelling has reduced primarily from omitting gluten and reducing refined sugars in the diet, though she still craves sugar and eats a primarily carbohydrate dense diet.    General Impression    There are two areas Yolanda Wright and I focused on today:  Hyperlipidemia and histamine intolerance.    Hyperlipidemia:  Her cholesterol and TG's are significantly elevated.  She does not consume much in the way of saturated fats or animal products, but she does eat a significant amount of carbohydrates to quell her sweet tooth.  I do question if a complete grain restriction may benefit her in this area and it would also be interesting to see if it helps with her swelling, tinnitus and other allergy type symptoms.  This diet would need to be followed for an extended period of time to see if it impacts her cholesterol and TG numbers, so I would like to focus on histamine intolerance first since this can be determined in a 2 week period of time.    Histamine intolerance:  She has multiple symptoms that could be related to histamine production including hot flashes, tinnitus, gas, belch, sinus pain/pressure, anxiety and poor sleep.  Her swelling symptoms increased while traveling home to [place], so I do question whether an environmental change may have stimulated her inflammation at that time.  Her symptoms have improved the longer she has been back in [place], but I think it's worth ruling out histamine intolerance first to see if this will benefit  her.  We will start here and instruction for a low histamine diet was provided today.  We will f/u in 3 weeks to reassess.  If there is no benefit, will move forward with a low starch diet.    Session length:  90 mins    Subjective/Assessment     Excesses:  carbohydrates   Deficiencies:  water   Meal balance:  Needs to bump up her protein, mostly carb based.  Omega 3/6 ratio:  OK  Plant intake:  Excellent, juices, eats a mostly plant based diet.  Fluids:  Low water intake, 3 small containers qd.  Coffee 1 cup qd.  Should considering increasing water.  Did not discuss today, need to at next session.  Food sensitivities or allergies:  Found that gluten/sugar free has improved her symptoms of swelling considerably.  Should consider looking at all grains, especially as it r/t her cholesterol and Tg's, but will start with r/o histamine intolerance first given the nature of her symptoms.  Food likes/dislikes/avoidances:  Craves sugar.  No dislikes.  Digestion:  Gas (belching) and bloat, worse after eating.  Also notes intensification with sardines and aged meats.  Do question histamine intolerance given the nature of her reactions.  But her sugar cravings and benefits from a gluten/sugar free diet do increase my suspicion around bacterial issues.  If no benefit from histamine diet, should consider HBT.  Denies diarrhea, constipation, heartburn, nausea, vomiting.  Weight:  Stable. BMI 23 WNL.  Physical activity:  Just started yoga.  Has been low secondary  to pain/inflammation/fatigue.  Supplements:  Taking D, B12, MVI.  Did not make any recommendations today, will hold until I get a better sense of what she needs.  She did find benefit to her cholesterol by taking niacin and garlic, but is no longer taking. May encourage return.    DNI:  NA  Pertinent family hx:  NA  Cooking skills:  Excellent, former Investment banker, operationalchef.    Shopping preferences:  CM, TJ  Restaurant habits:  No    Emotional and Social    Low stress, but does note that  her swelling increases when she is more stressed.  Emotional eater.  Husband, Gery PrayBarry [place] be [place], may necessitate move at the end of February.      Plan    Low histamine diet x 2 wks, then challenge  Discuss water intake at next session.  F/u 3 wks.

## 2013-10-01 ENCOUNTER — Ambulatory Visit (INDEPENDENT_AMBULATORY_CARE_PROVIDER_SITE_OTHER): Payer: BLUE CROSS/BLUE SHIELD | Admitting: Registered"

## 2013-10-01 DIAGNOSIS — E7800 Pure hypercholesterolemia, unspecified: Secondary | ICD-10-CM

## 2013-10-01 DIAGNOSIS — E781 Pure hyperglyceridemia: Secondary | ICD-10-CM

## 2013-10-01 NOTE — Progress Notes (Signed)
S:  Two weeks on a low histamine diet brought significant benefits to sleep, anxiety, sinuses, vision, digestion, body swelling and energy.  She has since returned some of the histamines back to the diet but as long as she limits them, she is able to keep her inflammation under control.  No changes to hot flashes.  Still must avoid sugars in order to avoid severe GI issues.  Still having gas and bloat, but better on low histamine.  Still wants to know how to manage cholesterol and TG's.    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  I provided her education on managing it including supplementation and dietary threshold.  It's unclear how this will benefit her cholesterol and TG's however the supplementation I have recommended involves liver support which may help with cholesterol management.  I would like to have her levels checked again in a month or two after practicing a low histamine diet to see if reducing inflammation impacts her lipids at all. She is doing better at managing her sugar intake, but her GI is still an issue and given the level of food sensitivities she has, I would like to order a HBT to rule out SIBO as a stimulator of histamines and food sensitivities.  If it's positive we will need to focus more on reducing carbohydrates which, in turn, may help reduce her TG's and chol.  We'll f/u with HBT results in hand.    P:  1.  Determine personal threshold to histamines.  Add in higher histamine fruits and veg (tomatoes, spinach, avocado and banana are higher). Play with amounts to determine how much histamine you can tolerate.      2.  Diamine Oxidase:  An enzyme that can help to break down histamines in foods.  Take with meals.  Available at [employer].    *  Histame  *  Swanson's Daosin  *  Xymogen Histdao      3.  Supplements to help with liver clearance of histamines     --  L-methionine:  1,000 mg twice per day   -- NAC:  500 mg twice per day   --  B6:  100 mg twice per day   -- zinc:  30 mg twice per day with food.  Take zinc for three months only then discontinue.     4.  Supplements to try to reduce histamine load when having a reaction     -- Quercitin   -- Stinging nettle tea   -- vitamin C:  Up to 5,000 mg per day, increase slowly as tolerated, in divided doses to avoid diarrhea    5.  Resources:     -- aminerecipes.com   -- AndroidPDAs.co.[email or web address]   -- failsafediet.org    6.  Relaxation, meditation, yoga, deep breathing before meals -- stimulate parasympathetic nervous system activity to reduce hypersensitization of nerves.      7.  Hydrogen breath test:  [facility] at [phone] to schedule a hydrogen breath test to rule out SIBO.    8.  Let's follow up once you have had the hydrogen breath test completed.

## 2013-10-01 NOTE — Patient Instructions (Signed)
1.  Determine personal threshold to histamines.  Add in higher histamine fruits and veg (tomatoes, spinach, avocado and banana are higher). Play with amounts to determine how much histamine you can tolerate.      2.  Diamine Oxidase:  An enzyme that can help to break down histamines in foods.  Take with meals.  Available at [employer].    *  Histame  *  Swanson's Daosin  *  Xymogen Histdao      3.  Supplements to help with liver clearance of histamines     --  L-methionine:  1,000 mg twice per day   -- NAC:  500 mg twice per day   -- B6:  100 mg twice per day   -- zinc:  30 mg twice per day with food.  Take zinc for three months only then discontinue.     4.  Supplements to try to reduce histamine load when having a reaction     -- Quercitin   -- Stinging nettle tea   -- vitamin C:  Up to 5,000 mg per day, increase slowly as tolerated, in divided doses to avoid diarrhea    5.  Resources:     -- aminerecipes.com   -- AndroidPDAs.co.[email or web address]   -- failsafediet.org    6.  Relaxation, meditation, yoga, deep breathing before meals -- stimulate parasympathetic nervous system activity to reduce hypersensitization of nerves.      7.  Hydrogen breath test:  Call [facility] at [phone] to schedule a hydrogen breath test to rule out SIBO.    8.  Let's follow up once you have had the hydrogen breath test completed.

## 2013-10-02 ENCOUNTER — Encounter (INDEPENDENT_AMBULATORY_CARE_PROVIDER_SITE_OTHER): Payer: Self-pay

## 2013-10-02 NOTE — Progress Notes (Signed)
Mailed all records to Patient (Notes, labs, DXA and xray reports) Initial Consult was already mailed to pt. NO charge.

## 2013-10-14 ENCOUNTER — Encounter (INDEPENDENT_AMBULATORY_CARE_PROVIDER_SITE_OTHER): Payer: BLUE CROSS/BLUE SHIELD

## 2013-12-31 ENCOUNTER — Encounter (INDEPENDENT_AMBULATORY_CARE_PROVIDER_SITE_OTHER): Payer: BLUE CROSS/BLUE SHIELD | Admitting: Registered"

## 2014-01-25 ENCOUNTER — Ambulatory Visit (INDEPENDENT_AMBULATORY_CARE_PROVIDER_SITE_OTHER): Payer: BLUE CROSS/BLUE SHIELD | Admitting: Registered"

## 2014-01-25 DIAGNOSIS — K5900 Constipation, unspecified: Secondary | ICD-10-CM

## 2014-01-25 NOTE — Progress Notes (Signed)
S:  Reduced the fruit and sugar in her diet and is feeling considerably improved energetically, not having daily fatigue and energy crashes in the mid afternoon.  Eating a low histamine diet.  Has not had her triglycerides checked.  Had a kidney stone and while she was being xrayed it was discovered that she had significant amount of stool in her colon.  Taking Miralax for the last month and while it makes her stools smoother and easier to pass, still doesn't feel like she's clearing well.  Digestive enzymes very helpful with motility when she remembers to take them.  More belching when she is more constipated.  Visits to discuss hydrogen breath tests.  Still having swelling in her hands with use.    A:  We have seen great improvements in Yolanda Wright's energy from reducing sugars in the diet indicating there was a hypoglycemic effect going on with her sugar intake.  I do anticipate this reduction in sugar will also help to reduce her TG's.  Await test results.  Her hydrogen breath test was normal for SIBO so her constipation appears to be functional.  The digestive enzymes help to move things along indicating pancreatic insufficiency.  She is not great at taking them so encouraged her to be more adherent.  I'd like to add in a probiotic that contains l. reuteri based on recent research showing significant improvement in constipation sx when it is taking daily.  I'd also like to add in a motility agent (Motilpro) to see if this helps to encourage natural peristalsis.  The more we can detoxify and move her gut, the better she is likely to feel.  I do question whether improving detoxification may also help to reduce her liver inflammation and histamine intolerance.  She is still getting hand pain upon exertion, so asked her to take an antihistamine the next time this occurs to see if beneficial.  She is traveling to [place] and then to [place] this summer.  We will f/u in August to reassess.      P 1.  Increase water:  Take  2 cups of room temperature water with a small pinch of sea salt, 6-8 times over the course of the day for two to three days to see how this impacts your motility.  Try to keep your intake at 4-6 two cup servings per day.      2.  Email me information on your probiotic.  Take daily.    3.  Start Motilpro:  3 caps at bedtime.  If this causes any burning sensation, reduce to 1 cap and increase up to three as tolerated.    4.  Maintain the Miralax for another week, then discontinue and see if you can maintain regular, easy bowel movements without it.    5.  Increase high prebiotic foods such as onion, garlic, broccoli, brussels sprouts, stone fruits, cabbage.      6.  Antihistamine when joint pain flares next.    7.   Follow up in August.

## 2014-01-25 NOTE — Patient Instructions (Signed)
1.  Increase water:  Take 2 cups of room temperature water with a small pinch of sea salt, 6-8 times over the course of the day for two to three days to see how this impacts your motility.  Try to keep your intake at 4-6 two cup servings per day.      2.  Email me information on your probiotic.  Take daily.    3.  Start Motilpro:  3 caps at bedtime.  If this causes any burning sensation, reduce to 1 cap and increase up to three as tolerated.    4.  Maintain the Miralax for another week, then discontinue and see if you can maintain regular, easy bowel movements without it.    5.  Increase high prebiotic foods such as onion, garlic, broccoli, brussels sprouts, stone fruits, cabbage.      6.  Follow up in August.

## 2022-07-24 IMAGING — MR MRI LUMBAR SPINE WITHOUT CONTRAST
4 series · 48 of 48 positions shown · IV contrast (Off)
Comparison: none

MRI OF THE LUMBAR SPINE WITHOUT CONTRAST
CLINICAL HISTORY: Low back pain, left hip pain.
TECHNIQUE: Multisequential multiplanar imaging was performed of the lumbar spinal region.

[Series 1: z s-c scano · coronal · 6.0mm · 1.17mm/px · 4 of 6 slices shown]
[im 1/6]
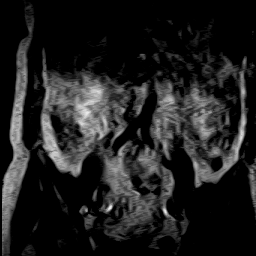
[im 2/6]
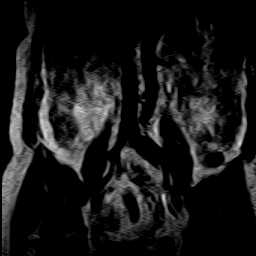
[im 4/6]
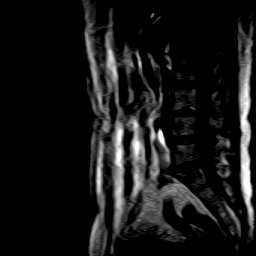
[im 6/6]
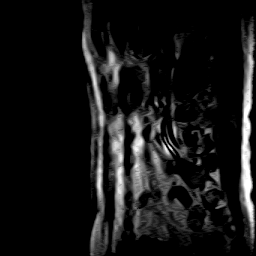

[Series 2: T2 · sagittal · 5.0mm · 1.13mm/px · 11 of 13 slices shown (1 of 2)]
[im 1/13]
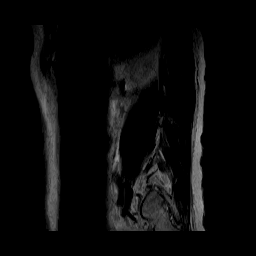
[im 2/13]
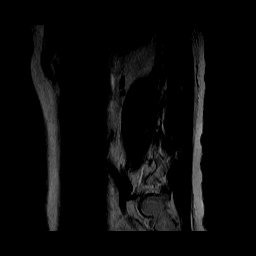
[im 3/13]
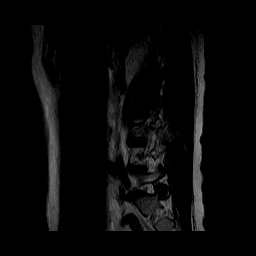
[im 4/13]
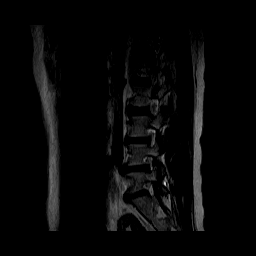
[im 5/13]
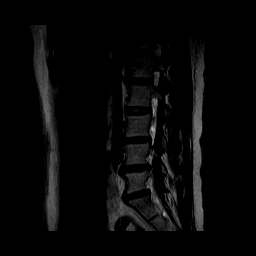
[im 7/13]
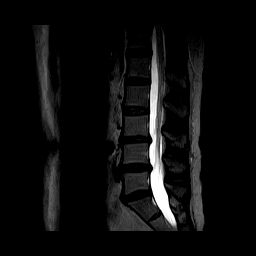
[im 8/13]
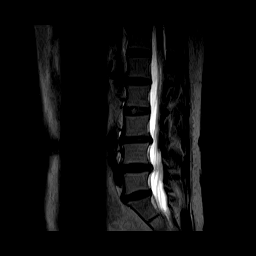
[im 9/13]
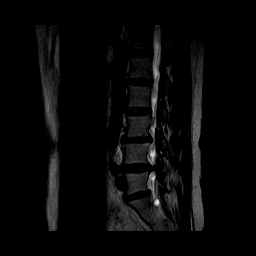
[im 10/13]
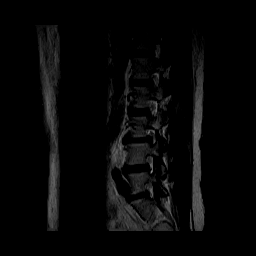
[im 11/13]
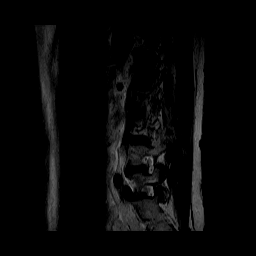
[im 13/13]
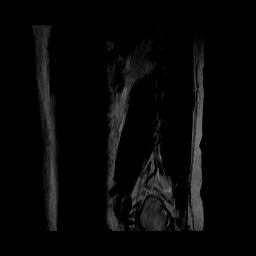

[Series 3: T1 · sagittal · 5.0mm · 1.13mm/px · 11 of 13 slices shown]
[im 1/13]
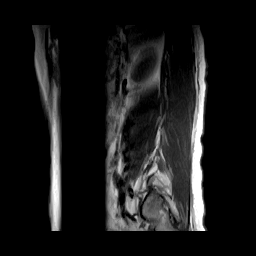
[im 2/13]
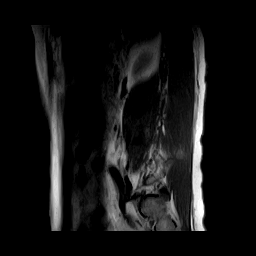
[im 3/13]
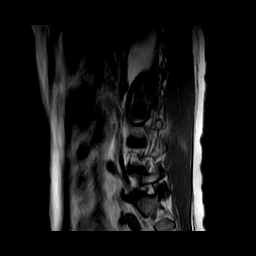
[im 4/13]
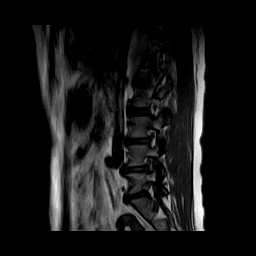
[im 5/13]
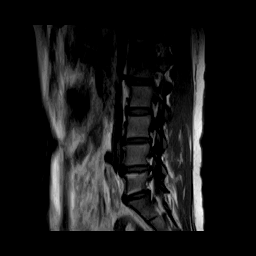
[im 7/13]
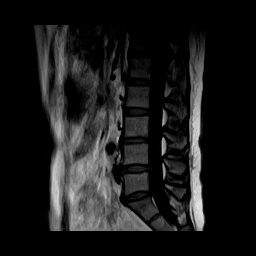
[im 8/13]
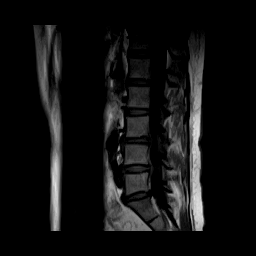
[im 9/13]
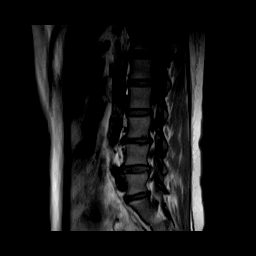
[im 10/13]
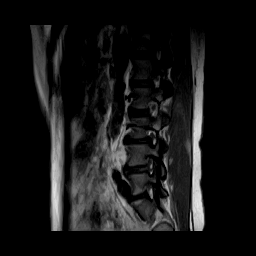
[im 11/13]
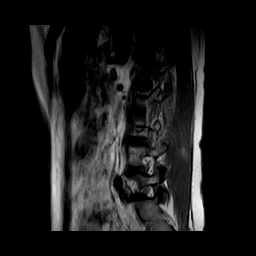
[im 13/13]
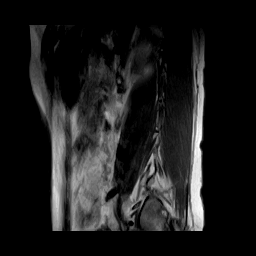

[Series 5: T2 · axial · 4.0mm · 1.17mm/px · z∈[-58,+92]mm · 22 of 26 slices shown (2 of 2)]
[im 1/26]
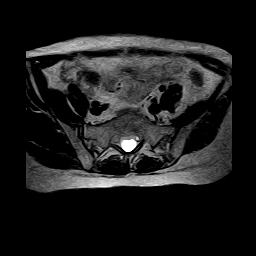
[im 2/26]
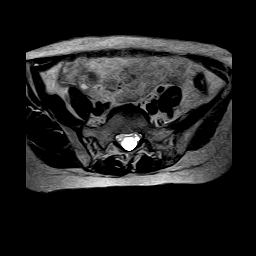
[im 3/26]
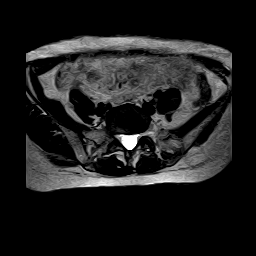
[im 4/26]
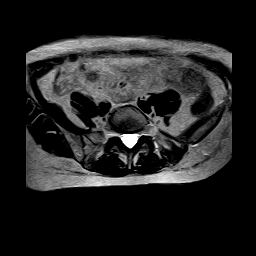
[im 5/26]
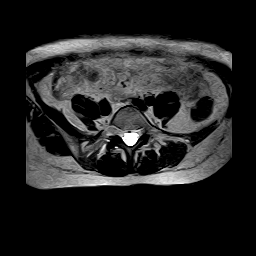
[im 6/26]
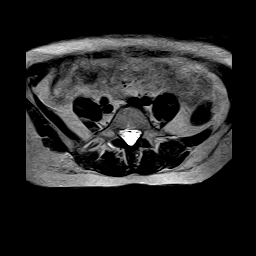
[im 8/26]
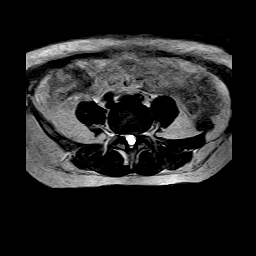
[im 9/26]
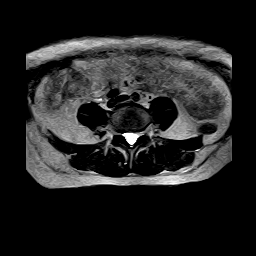
[im 10/26]
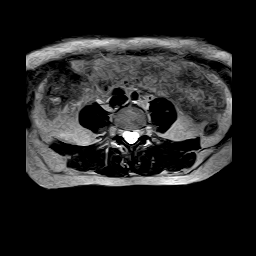
[im 11/26]
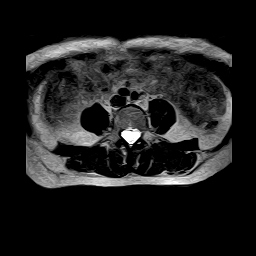
[im 12/26]
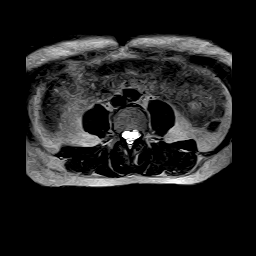
[im 14/26]
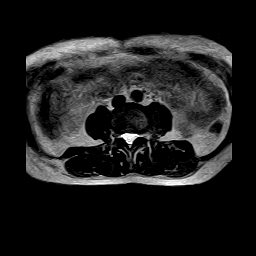
[im 15/26]
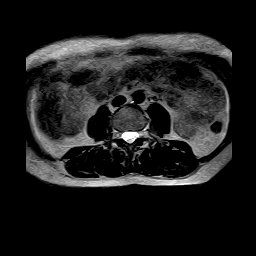
[im 16/26]
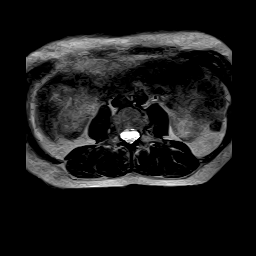
[im 17/26]
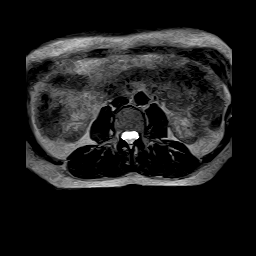
[im 18/26]
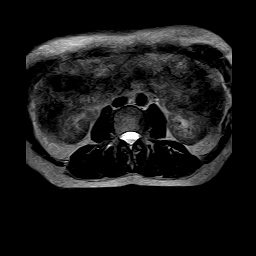
[im 20/26]
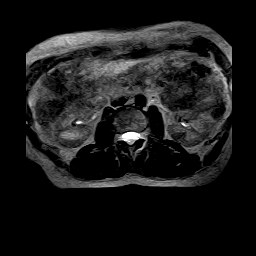
[im 21/26]
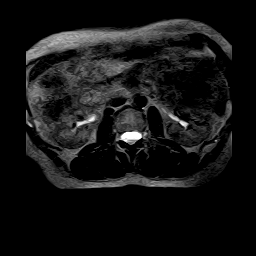
[im 22/26]
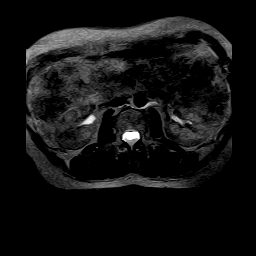
[im 23/26]
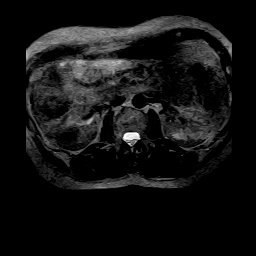
[im 24/26]
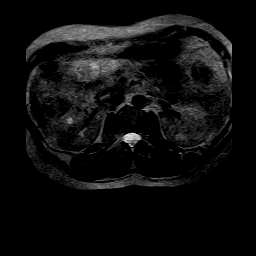
[im 26/26]
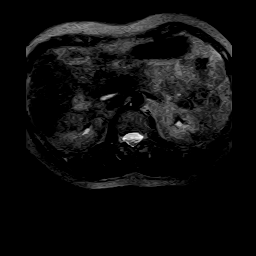

[48 of 48 positions shown; findings below may reference images not displayed]

FINDINGS: There is a normal marrow signal noted throughout the lumbar vertebral bodies. The conus medullaris is unremarkable and there is no obvious intradural abnormality noted. There is moderate facet arthrosis and ligamentum flavum hypertrophy throughout the lumbar spinal region. 

At L1-L2 level, there is no evidence of disc herniation, neural foraminal narrowing, or spinal stenosis. 

At L2-L3 level, there is no evidence of disc herniation, neural foraminal narrowing, or spinal stenosis. 

At L3-L4 level, there is narrowing, anterior, posterior, and lateral osteophyte, desiccation, and 2 to 2.5 mm broad-based herniation. All findings asymmetric toward the left side with moderate spinal stenosis. 

At L4-L5 level, there is anterior, posterior, and lateral osteophyte and 3 to 3.5 mm left lateral herniation with effacement of the left nerve root and moderate spinal stenosis. 

At L5-S1 level, there is anterior osteophyte and 1.5 mm bulging.
IMPRESSION: Moderate spinal stenosis at L4-L5 including anterior, posterior, and lateral osteophyte and 3 to 3.5 mm left lateral herniation. This is also causing effacement of the left nerve root. Moderate spinal stenosis at L3-L4 including narrowing, anterior, posterior, and lateral osteophyte, desiccation, and 2 to 2.5 mm broad-based herniation with all findings asymmetric toward the left side. Anterior osteophyte and 1.5 mm bulging at L5-S1. There is moderate facet arthrosis and ligamentum flavum hypertrophy throughout.

## 2022-07-24 IMAGING — MR MRI HIP LT W/O CONTRAST
7 series · 40 of 40 positions shown · IV contrast (Off)
Comparison: none

MRI OF THE LEFT HIP WITHOUT CONTRAST
CLINICAL HISTORY: Low back pain, left hip pain.
TECHNIQUE: Multisequential multiplanar imaging was performed of the lower pelvis with particular attention to the left hip.

[Series 1: z t/s/c scano · axial · left · 10.0mm · 1.64mm/px · z∈[-21,+210]mm · 3 of 12 slices shown]
[im 1/12]
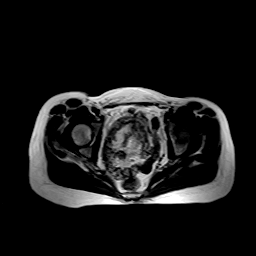
[im 6/12]
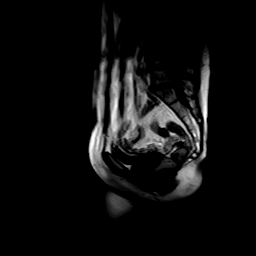
[im 12/12]
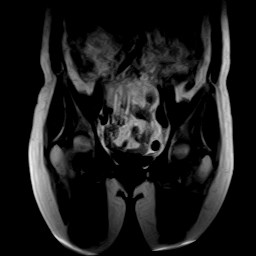

[Series 2: T1 · coronal · left · 4.0mm · 1.56mm/px · 6 of 18 slices shown (1 of 2)]
[im 1/18]
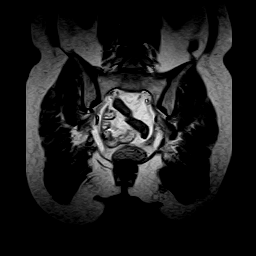
[im 4/18]
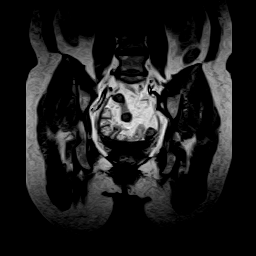
[im 7/18]
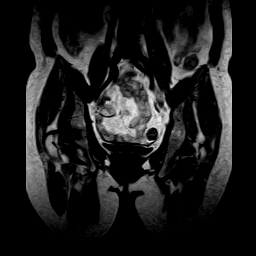
[im 11/18]
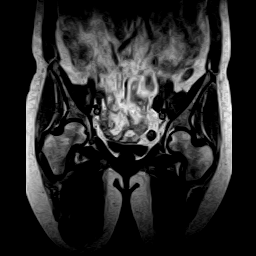
[im 14/18]
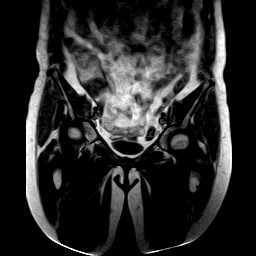
[im 18/18]
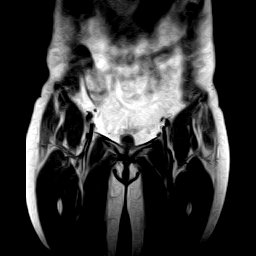

[Series 3: T1 · axial · left · 4.0mm · 1.09mm/px · z∈[-103,+2]mm · 7 of 22 slices shown (2 of 2)]
[im 1/22]
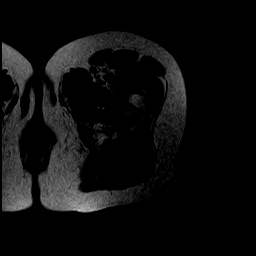
[im 4/22]
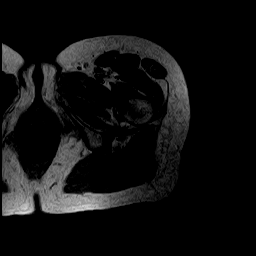
[im 8/22]
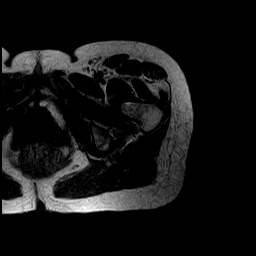
[im 11/22]
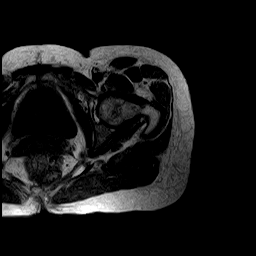
[im 15/22]
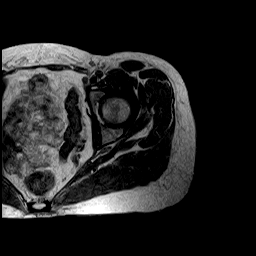
[im 18/22]
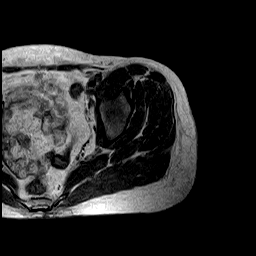
[im 22/22]
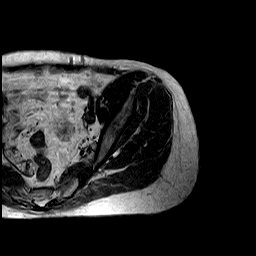

[Series 4: T2 · axial · left · 4.0mm · 1.09mm/px · z∈[-103,+2]mm · 7 of 22 slices shown (1 of 3)]
[im 1/22]
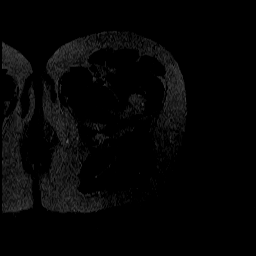
[im 4/22]
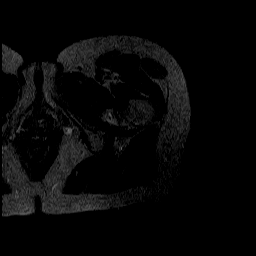
[im 8/22]
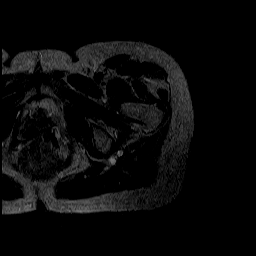
[im 11/22]
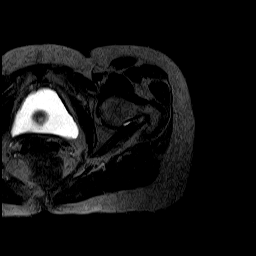
[im 15/22]
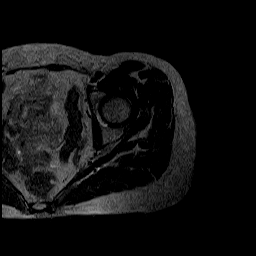
[im 18/22]
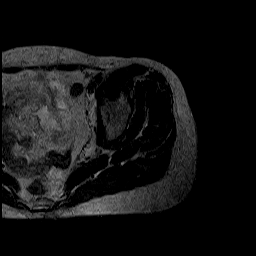
[im 22/22]
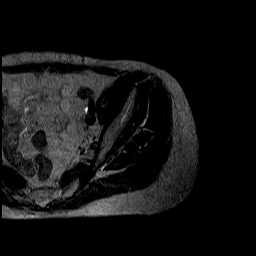

[Series 5: STIR · coronal · left · 5.0mm · 1.09mm/px · 6 of 18 slices shown]
[im 1/18]
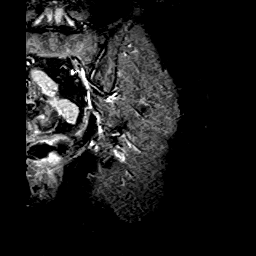
[im 4/18]
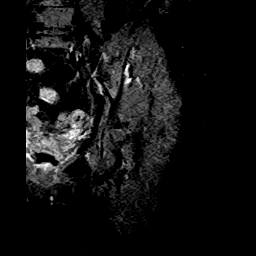
[im 7/18]
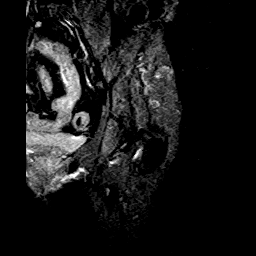
[im 11/18]
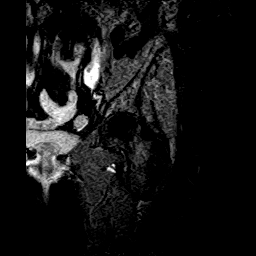
[im 14/18]
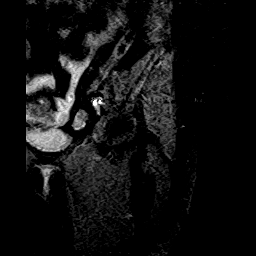
[im 18/18]
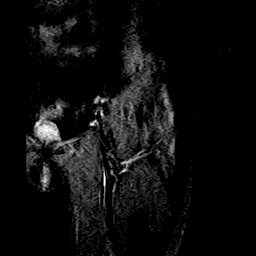

[Series 6: T2 · coronal · left · 4.0mm · 1.09mm/px · 6 of 18 slices shown (2 of 3)]
[im 1/18]
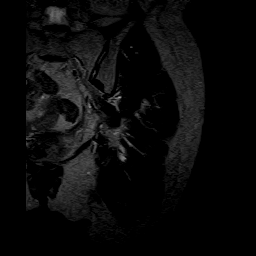
[im 4/18]
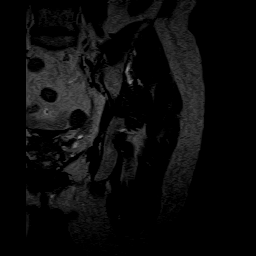
[im 7/18]
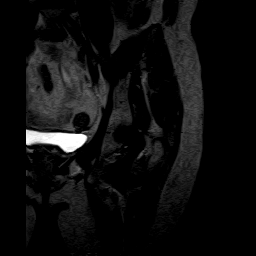
[im 11/18]
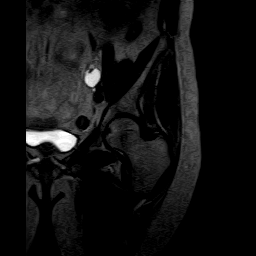
[im 14/18]
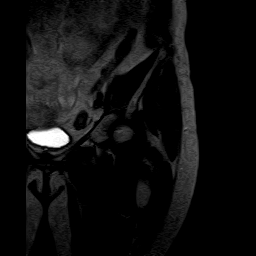
[im 18/18]
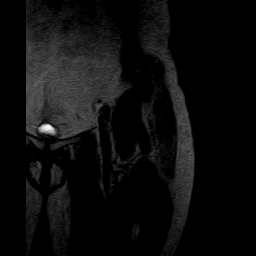

[Series 7: T2 · sagittal · left · 4.0mm · 1.09mm/px · 5 of 16 slices shown (3 of 3)]
[im 1/16]
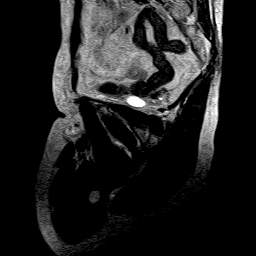
[im 4/16]
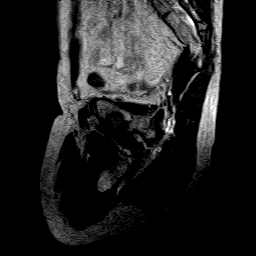
[im 8/16]
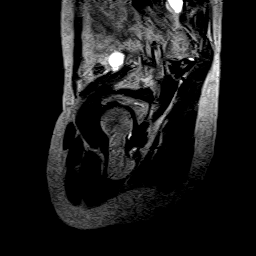
[im 12/16]
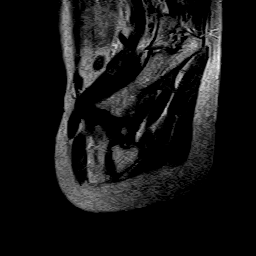
[im 16/16]
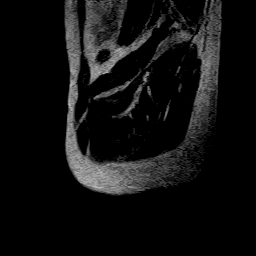

[40 of 40 positions shown; findings below may reference images not displayed]

FINDINGS: There appears to be moderate degenerative change of both hip joints. 

Regarding the left hip there is no soft tissue masses, fluid, or edema associated with the left hip joint, however, there does appear to be a large soft tissue mass in the lower pelvis, which may represent a bladder mass or possibly a colorectal mass lesion. This is effacing the inferior aspect of the bladder. 

No abnormal signal in the visualized musculature.
IMPRESSION: 1.
Moderate degenerative change of both hip joints including joint space narrowing and osteophytosis. Regarding the left hip joint, there is no soft tissue masses, fluid, or edema. 

2.
Evidence of soft tissue signal intensity in the lower pelvis, which appears to represent a large mass. This may represent a bladder mass or possibly a colorectal mass that is effacing the bladder. The patient did not relate a history of neoplastic disease. It may be useful to perform a CT scan of the pelvis with oral and IV contrast to help differentiate these two possibilities. Bladder ultrasound also may be useful.
